# Patient Record
Sex: Female | Born: 1980 | Race: White | Hispanic: No | Smoking: Current every day smoker
Health system: Southern US, Community
[De-identification: ages and names within clinical notes are randomized; demographics above are authoritative.]

## PROBLEM LIST (undated history)

## (undated) DIAGNOSIS — F419 Anxiety disorder, unspecified: Secondary | ICD-10-CM

## (undated) DIAGNOSIS — J45909 Unspecified asthma, uncomplicated: Secondary | ICD-10-CM

## (undated) DIAGNOSIS — F32A Depression, unspecified: Secondary | ICD-10-CM

---

## 2019-12-01 ENCOUNTER — Emergency Department
Admission: EM | Admit: 2019-12-01 | Discharge: 2019-12-01 | Disposition: A | Discharge status: Home / Self Care | Payer: Medicaid Other | Attending: Emergency Medicine | Admitting: Emergency Medicine

## 2019-12-01 ENCOUNTER — Other Ambulatory Visit: Payer: Self-pay

## 2019-12-01 ENCOUNTER — Emergency Department: Payer: Medicaid Other

## 2019-12-01 DIAGNOSIS — J45909 Unspecified asthma, uncomplicated: Secondary | ICD-10-CM | POA: Insufficient documentation

## 2019-12-01 DIAGNOSIS — R0602 Shortness of breath: Secondary | ICD-10-CM | POA: Insufficient documentation

## 2019-12-01 DIAGNOSIS — R059 Cough, unspecified: Secondary | ICD-10-CM | POA: Diagnosis not present

## 2019-12-01 DIAGNOSIS — F17200 Nicotine dependence, unspecified, uncomplicated: Secondary | ICD-10-CM | POA: Insufficient documentation

## 2019-12-01 DIAGNOSIS — R062 Wheezing: Secondary | ICD-10-CM

## 2019-12-01 HISTORY — DX: Depression, unspecified: F32.A

## 2019-12-01 HISTORY — DX: Anxiety disorder, unspecified: F41.9

## 2019-12-01 HISTORY — DX: Unspecified asthma, uncomplicated: J45.909

## 2019-12-01 MED ORDER — ALBUTEROL SULFATE (2.5 MG/3ML) 0.083% IN NEBU
5.0000 mg | INHALATION_SOLUTION | Freq: Once | RESPIRATORY_TRACT | Status: AC
  Administered 2019-12-01: 2.5 mg via RESPIRATORY_TRACT
  Filled 2019-12-01: qty 6

## 2019-12-01 MED ORDER — IPRATROPIUM-ALBUTEROL 0.5-2.5 (3) MG/3ML IN SOLN
3.0000 mL | Freq: Once | RESPIRATORY_TRACT | Status: AC
  Administered 2019-12-01: 3 mL via RESPIRATORY_TRACT
  Filled 2019-12-01: qty 3

## 2019-12-01 MED ORDER — IPRATROPIUM-ALBUTEROL 0.5-2.5 (3) MG/3ML IN SOLN: 3.0000 mL | Freq: Once | RESPIRATORY_TRACT | 0 refills | Status: AC

## 2019-12-01 MED ORDER — AZITHROMYCIN 250 MG PO TABS: ORAL_TABLET | ORAL | 0 refills | Status: AC

## 2019-12-01 MED ORDER — PREDNISONE 50 MG PO TABS: ORAL_TABLET | ORAL | 0 refills | Status: AC

## 2019-12-01 MED ORDER — DEXAMETHASONE 10 MG/ML FOR PEDIATRIC ORAL USE
16.0000 mg | Freq: Once | INTRAMUSCULAR | Status: AC
  Administered 2019-12-01: 16 mg via ORAL
  Filled 2019-12-01: qty 2

## 2019-12-01 NOTE — Discharge Instructions (Signed)
You have been prescribed azithromycin and prednisone for bronchitis. You can take a DuoNeb every 6 hours.

## 2019-12-01 NOTE — ED Triage Notes (Addendum)
Pt states she gets bronchitis about once a year around this time and this is what it feels like- pt states she is short of breath and has a cough- pt has a hx of asthma and is a smoker- pt is wheezy- pt states she normally has a breathing machine she uses but she left it at home as she is here for a funeral

## 2019-12-01 NOTE — ED Notes (Signed)
Pt still wheezing after second neb tx. Provider aware and at bedside to assess pt

## 2019-12-01 NOTE — ED Provider Notes (Signed)
Emergency Department Provider Note  ____________________________________________  Time seen: Approximately 11:03 PM  I have reviewed the triage vital signs and the nursing notes.   HISTORY  Chief Complaint Shortness of Breath   Historian Patient    HPI Michaela Perkins is a 39 y.o. female with a history of asthma, presents to the emergency department with shortness of breath, wheezing and cough.  Patient states that she gets bronchitis every year and her current symptoms feel similar.  She states she is a daily smoker.  No fever or chills at home.  She has had some chest tightness but denies chest pain.  Patient states that she has been wheezing at home.   Past Medical History:  Diagnosis Date   Anxiety    Asthma    Depression      Immunizations up to date:  Yes.     Past Medical History:  Diagnosis Date   Anxiety    Asthma    Depression     There are no problems to display for this patient.   History reviewed. No pertinent surgical history.  Prior to Admission medications   Medication Sig Start Date End Date Taking? Authorizing Provider  azithromycin (ZITHROMAX Z-PAK) 250 MG tablet Take 2 tablets (500 mg) on  Day 1,  followed by 1 tablet (250 mg) once daily on Days 2 through 5. 12/01/19 12/06/19  Orvil Feil, PA-C  ipratropium-albuterol (DUONEB) 0.5-2.5 (3) MG/3ML SOLN Take 3 mLs by nebulization once for 1 dose. 12/01/19 12/01/19  Orvil Feil, PA-C  predniSONE (DELTASONE) 50 MG tablet Take one tablet once daily for the next five days. 12/01/19   Orvil Feil, PA-C    Allergies Patient has no known allergies.  No family history on file.  Social History Social History   Tobacco Use   Smoking status: Current Every Day Smoker   Smokeless tobacco: Never Used  Substance Use Topics   Alcohol use: Not on file   Drug use: Not on file     Review of Systems  Constitutional: No fever/chills Eyes:  No discharge ENT: No upper respiratory  complaints. Respiratory: Patient has cough and wheezing.  Gastrointestinal:   No nausea, no vomiting.  No diarrhea.  No constipation. Musculoskeletal: Negative for musculoskeletal pain. Skin: Negative for rash, abrasions, lacerations, ecchymosis.    ____________________________________________   PHYSICAL EXAM:  VITAL SIGNS: ED Triage Vitals  Enc Vitals Group     BP 12/01/19 2122 (!) 143/85     Pulse Rate 12/01/19 2122 (!) 118     Resp 12/01/19 2122 18     Temp 12/01/19 2122 99.1 F (37.3 C)     Temp Source 12/01/19 2122 Oral     SpO2 12/01/19 2122 95 %     Weight 12/01/19 2123 190 lb (86.2 kg)     Height 12/01/19 2123 5\' 4"  (1.626 m)     Head Circumference --      Peak Flow --      Pain Score 12/01/19 2122 0     Pain Loc --      Pain Edu? --      Excl. in GC? --     Constitutional: Alert and oriented. Patient is lying supine. Eyes: Conjunctivae are normal. PERRL. EOMI. Head: Atraumatic. ENT:      Ears: Tympanic membranes are mildly injected with mild effusion bilaterally.       Nose: No congestion/rhinnorhea.      Mouth/Throat: Mucous membranes are moist. Posterior pharynx is mildly  erythematous.  Hematological/Lymphatic/Immunilogical: No cervical lymphadenopathy.  Cardiovascular: Normal rate, regular rhythm. Normal S1 and S2.  Good peripheral circulation. Respiratory: Normal respiratory effort without tachypnea or retractions. Lungs CTAB. Good air entry to the bases with no decreased or absent breath sounds. Gastrointestinal: Bowel sounds 4 quadrants. Soft and nontender to palpation. No guarding or rigidity. No palpable masses. No distention. No CVA tenderness. Musculoskeletal: Full range of motion to all extremities. No gross deformities appreciated. Neurologic:  Normal speech and language. No gross focal neurologic deficits are appreciated.  Skin:  Skin is warm, dry and intact. No rash noted. Psychiatric: Mood and affect are normal. Speech and behavior are normal.  Patient exhibits appropriate insight and judgement.     ____________________________________________   LABS (all labs ordered are listed, but only abnormal results are displayed)  Labs Reviewed - No data to display ____________________________________________  EKG   ____________________________________________  RADIOLOGY Geraldo Pitter, personally viewed and evaluated these images (plain radiographs) as part of my medical decision making, as well as reviewing the written report by the radiologist.  DG Chest 2 View  Result Date: 12/01/2019 CLINICAL DATA:  Shortness of breath and cough. EXAM: CHEST - 2 VIEW COMPARISON:  None. FINDINGS: The heart size and mediastinal contours are within normal limits. Mild bilateral peribronchial cuffing is noted. Both lungs are otherwise clear. The visualized skeletal structures are unremarkable. IMPRESSION: Mild bilateral peribronchial cuffing which may represent mild bronchitis. Electronically Signed   By: Aram Candela M.D.   On: 12/01/2019 22:08    ____________________________________________    PROCEDURES  Procedure(s) performed:     Procedures     Medications  albuterol (PROVENTIL) (2.5 MG/3ML) 0.083% nebulizer solution 5 mg (2.5 mg Nebulization Given 12/01/19 2132)  ipratropium-albuterol (DUONEB) 0.5-2.5 (3) MG/3ML nebulizer solution 3 mL (3 mLs Nebulization Given 12/01/19 2201)  dexamethasone (DECADRON) 10 MG/ML injection for Pediatric ORAL use 16 mg (16 mg Oral Given 12/01/19 2201)  ipratropium-albuterol (DUONEB) 0.5-2.5 (3) MG/3ML nebulizer solution 3 mL (3 mLs Nebulization Given 12/01/19 2224)     ____________________________________________   INITIAL IMPRESSION / ASSESSMENT AND PLAN / ED COURSE  Pertinent labs & imaging results that were available during my care of the patient were reviewed by me and considered in my medical decision making (see chart for details).      Assessment and  plan Bronchitis 39 year old female presents to the emergency department with shortness of breath, wheezing and cough for the past 2 weeks.  Patient was tachycardic at triage and satting at 95% on room air.  She had wheezing auscultated diffusely.  No consolidations, opacities or infiltrates to suggest community-acquired pneumonia on chest x-ray.  Patient's wheezing improved significantly after 3 duo nebs.  Patient was given oral Decadron in the emergency department.  Patient was discharged with prednisone once daily for the next 5 days and azithromycin.  She was discharged with nebulized albuterol and Atrovent and was prescribed a nebulizer.  Return precautions were given to return with new or worsening symptoms.  All patient questions were answered.   ____________________________________________  FINAL CLINICAL IMPRESSION(S) / ED DIAGNOSES  Final diagnoses:  Wheezing      NEW MEDICATIONS STARTED DURING THIS VISIT:  ED Discharge Orders         Ordered    predniSONE (DELTASONE) 50 MG tablet        12/01/19 2258    azithromycin (ZITHROMAX Z-PAK) 250 MG tablet        12/01/19 2258    ipratropium-albuterol (DUONEB)  0.5-2.5 (3) MG/3ML SOLN   Once        12/01/19 2258    For home use only DME Nebulizer machine        12/01/19 2258              This chart was dictated using voice recognition software/Dragon. Despite best efforts to proofread, errors can occur which can change the meaning. Any change was purely unintentional.     Orvil Feil, PA-C 12/01/19 2311    Chesley Noon, MD 12/01/19 [phone]

## 2022-01-31 IMAGING — CR DG CHEST 2V
1 series · 2 of 2 positions shown · non-contrast
Comparison: None.

CLINICAL DATA: Shortness of breath and cough.

EXAM:
CHEST - 2 VIEW

[Series 1: dg chest 2 view · 0.14mm/px · 2 of 2 slices shown]
[im 1/2]
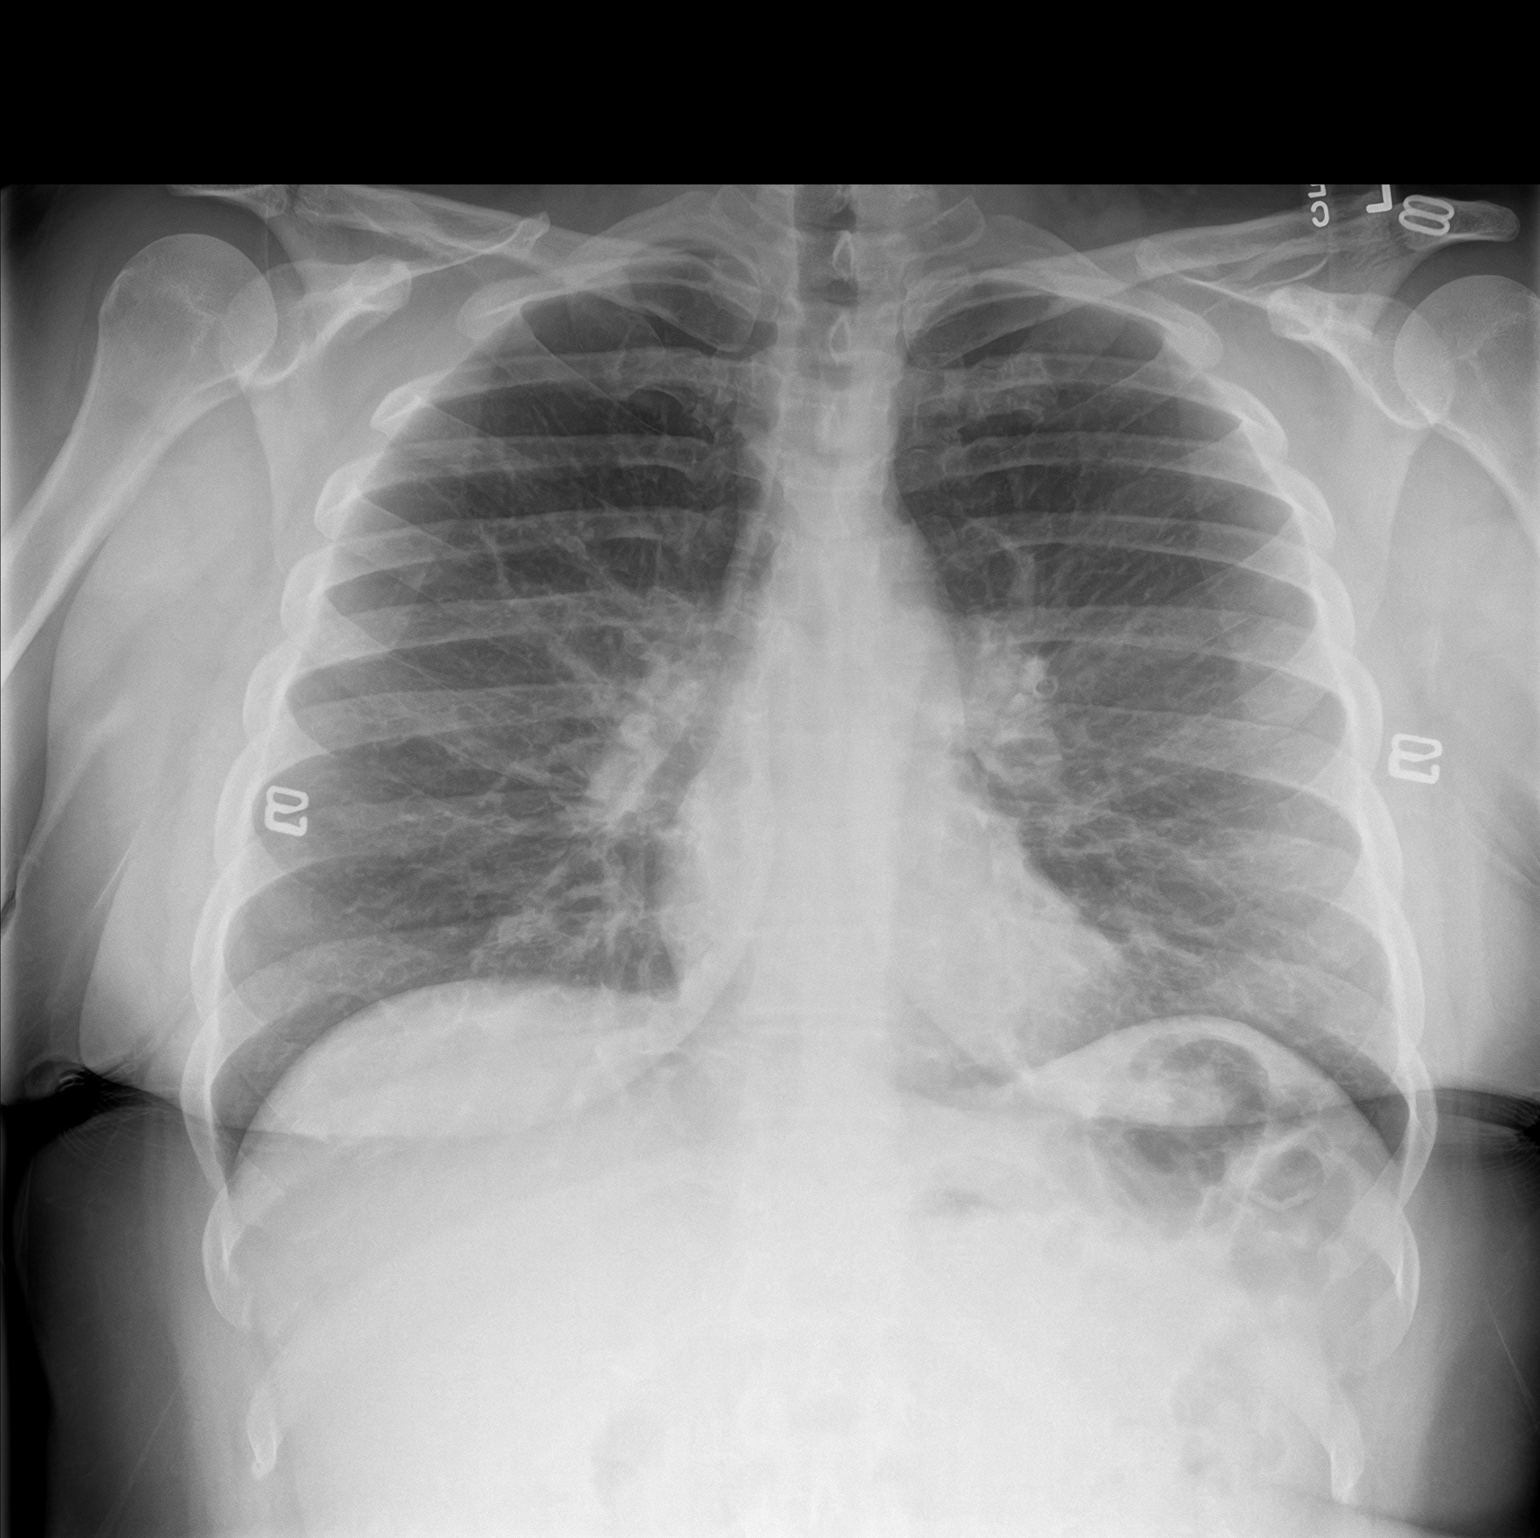
[im 2/2]
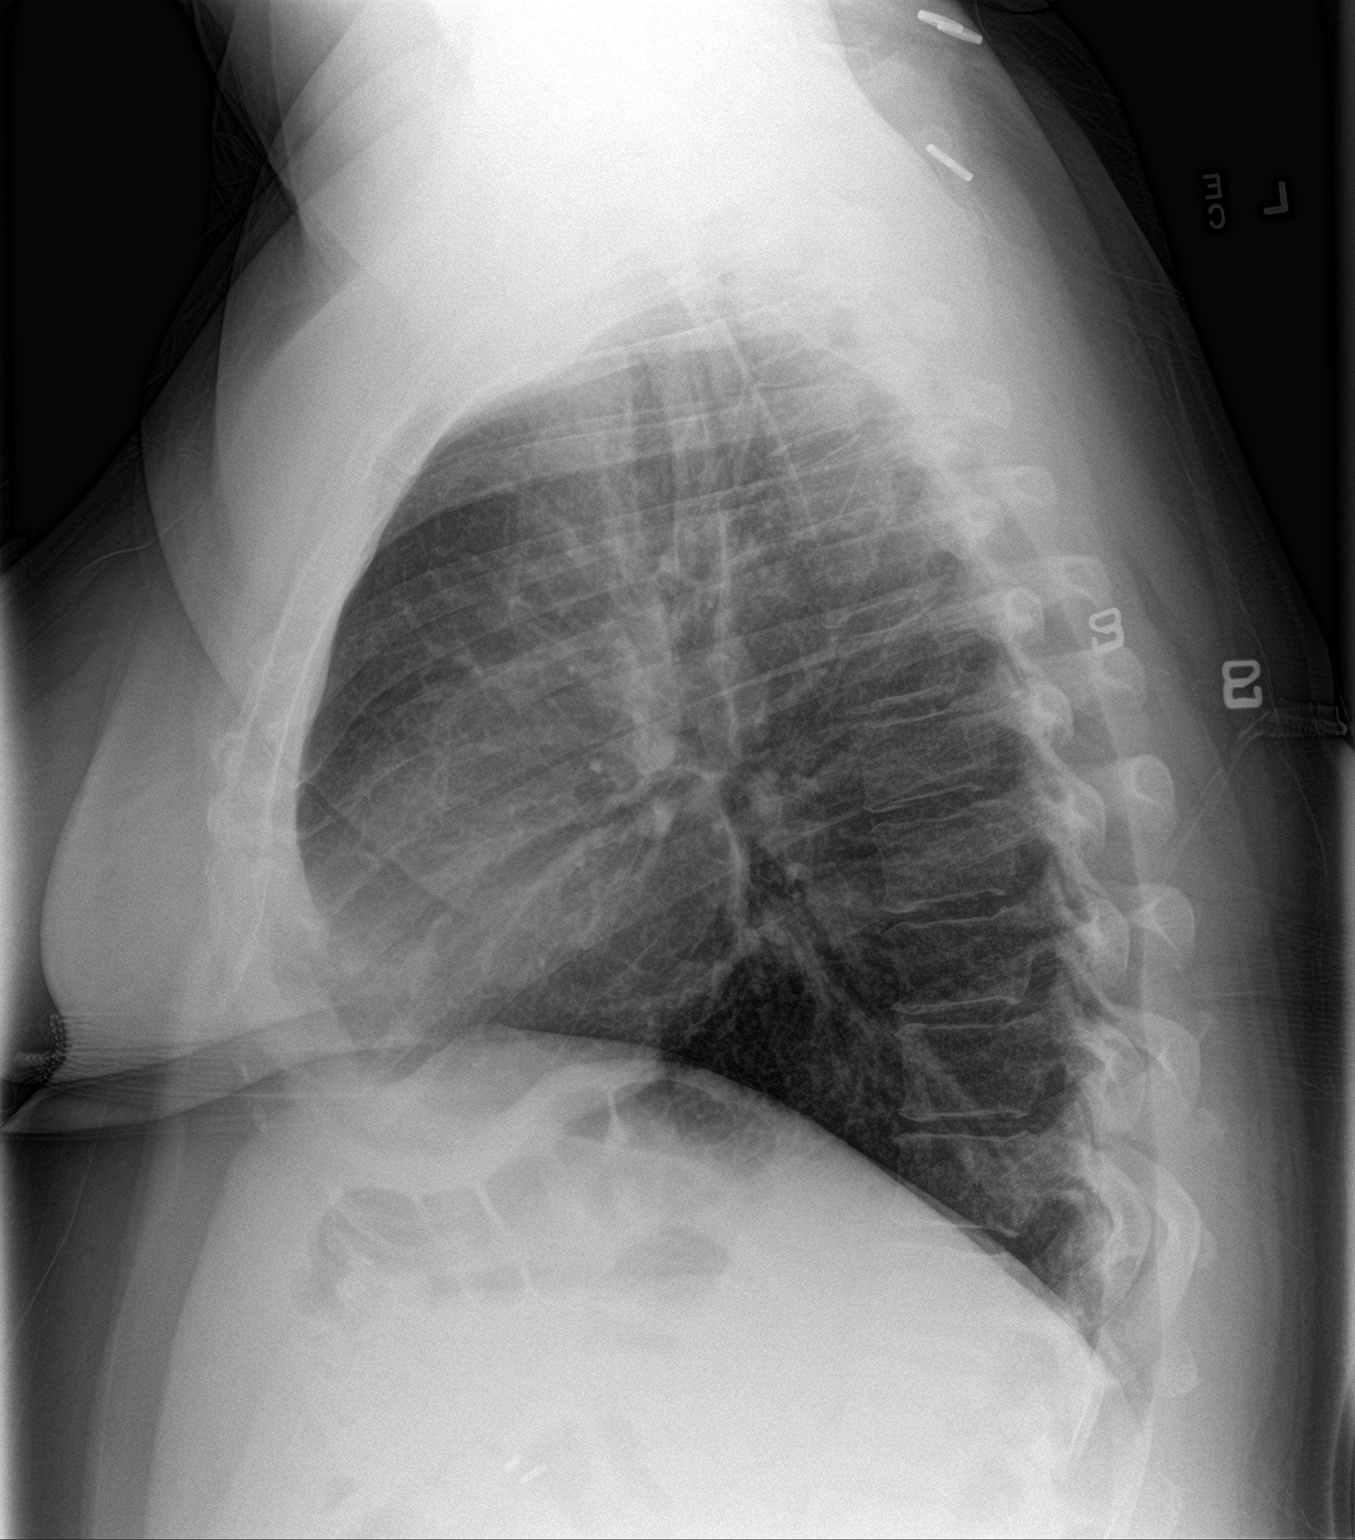

[2 of 2 positions shown; findings below may reference images not displayed]

FINDINGS: The heart size and mediastinal contours are within normal limits.
Mild bilateral peribronchial cuffing is noted. Both lungs are
otherwise clear. The visualized skeletal structures are
unremarkable.
IMPRESSION: Mild bilateral peribronchial cuffing which may represent mild
bronchitis.
